# Patient Record
Sex: Male | Born: 1940 | Race: White | Hispanic: No | Marital: Married | Smoking: Former smoker
Health system: Southern US, Community
[De-identification: ages and names within clinical notes are randomized; demographics above are authoritative.]

## PROBLEM LIST (undated history)

## (undated) DIAGNOSIS — E7800 Pure hypercholesterolemia, unspecified: Secondary | ICD-10-CM

## (undated) DIAGNOSIS — N400 Enlarged prostate without lower urinary tract symptoms: Secondary | ICD-10-CM

## (undated) DIAGNOSIS — J189 Pneumonia, unspecified organism: Secondary | ICD-10-CM

## (undated) DIAGNOSIS — N200 Calculus of kidney: Secondary | ICD-10-CM

## (undated) DIAGNOSIS — C4490 Unspecified malignant neoplasm of skin, unspecified: Secondary | ICD-10-CM

## (undated) DIAGNOSIS — I10 Essential (primary) hypertension: Secondary | ICD-10-CM

## (undated) HISTORY — PX: OTHER SURGICAL HISTORY: SHX169

## (undated) HISTORY — PX: COLONOSCOPY: SHX174

## (undated) HISTORY — PX: SEPTOPLASTY: SUR1290

## (undated) HISTORY — PX: TONSILLECTOMY: SUR1361

---

## 2007-04-25 ENCOUNTER — Ambulatory Visit: Payer: Self-pay | Admitting: Gastroenterology

## 2008-10-11 ENCOUNTER — Emergency Department: Payer: Self-pay | Admitting: Emergency Medicine

## 2008-10-21 ENCOUNTER — Emergency Department: Payer: Self-pay | Admitting: Emergency Medicine

## 2013-07-02 ENCOUNTER — Ambulatory Visit: Payer: Self-pay | Admitting: Gastroenterology

## 2013-07-04 LAB — PATHOLOGY REPORT

## 2014-05-02 ENCOUNTER — Ambulatory Visit: Payer: Self-pay | Admitting: Internal Medicine

## 2014-05-02 IMAGING — US ULTRASOUND RETROPERITONEAL COMPLETE
1 series · 14 of 25 positions shown · non-contrast
Comparison: None.

CLINICAL DATA: Abdominal aortic aneurysm screening. Hypertension.
Former smoker.

EXAM:
RETROPERITONEAL ULTRASOUND COMPLETE
TECHNIQUE: Ultrasound examination of the abdominal aorta was performed to
evaluate for abdominal aortic aneurysm. The common iliac arteries,
IVC, and kidneys were also evaluated.

[Series 1: ultrasound retroperitoneal complete · 0.30mm/px · 14 of 37 slices shown]
[im 1/37]
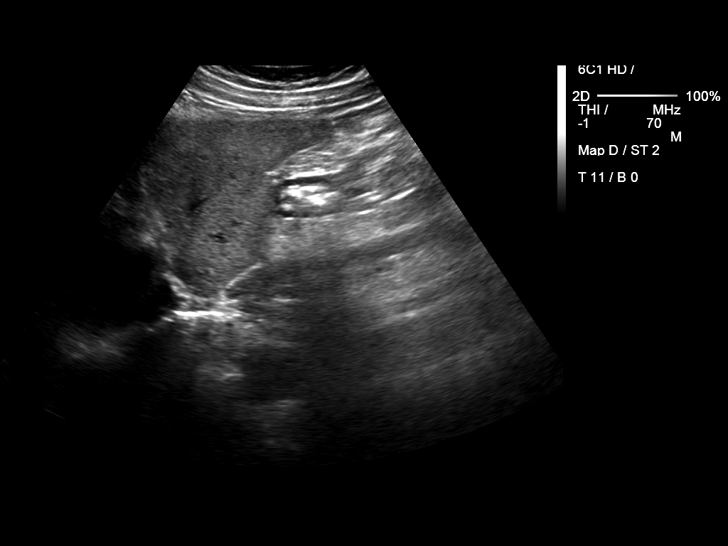
[im 4/37]
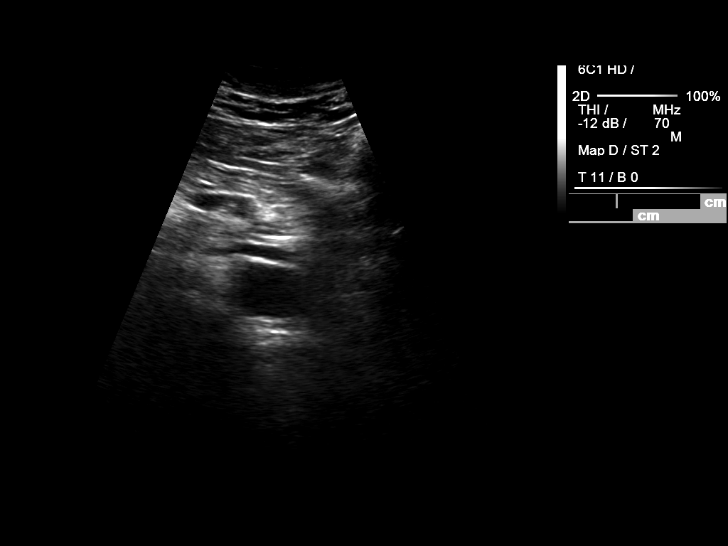
[im 7/37]
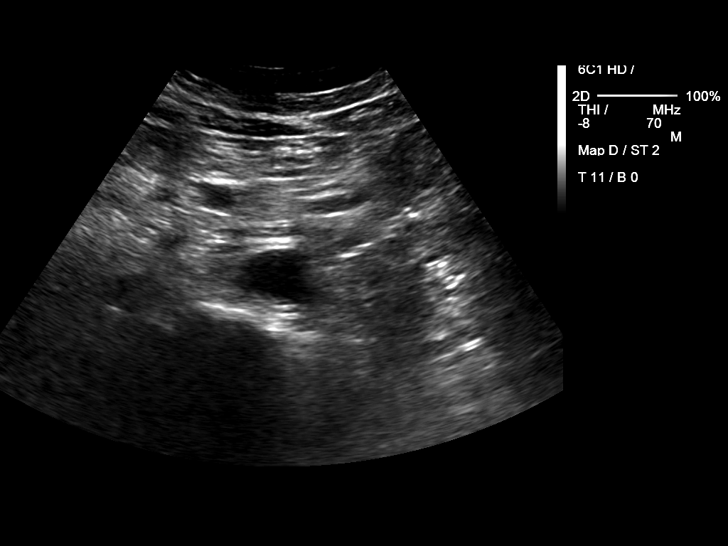
[im 10/37]
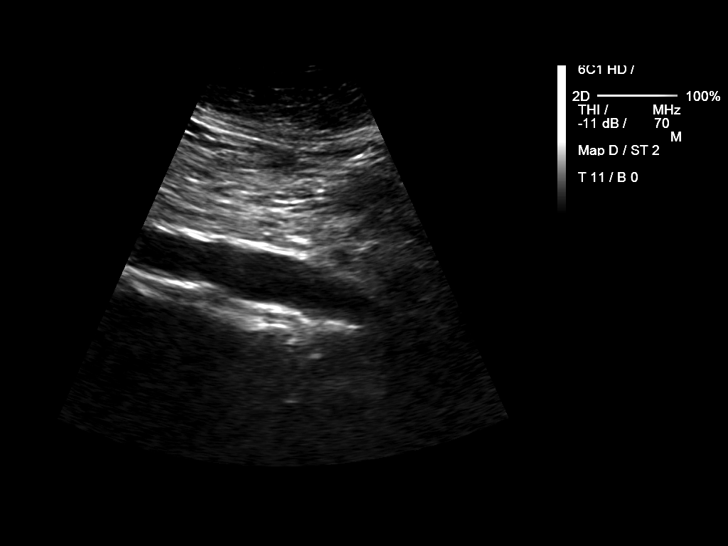
[im 13/37]
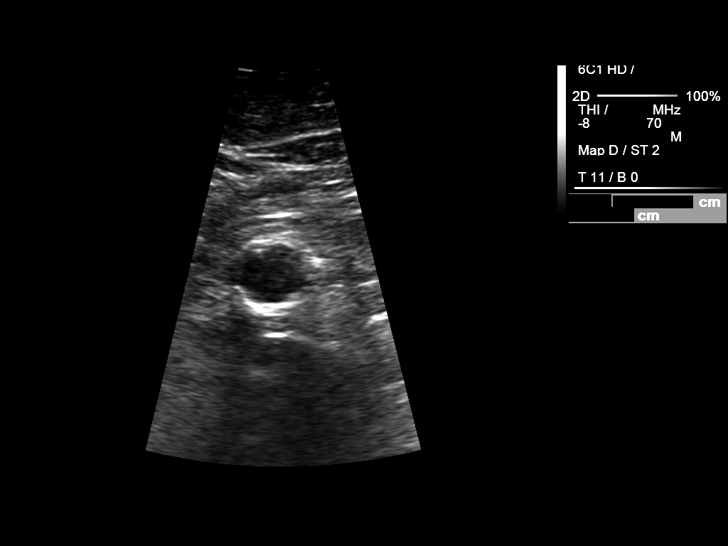
[im 14/37]
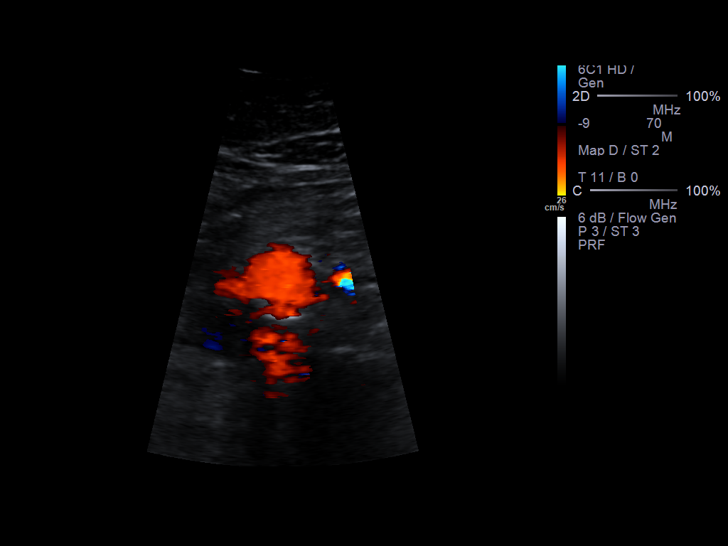
[im 17/37]
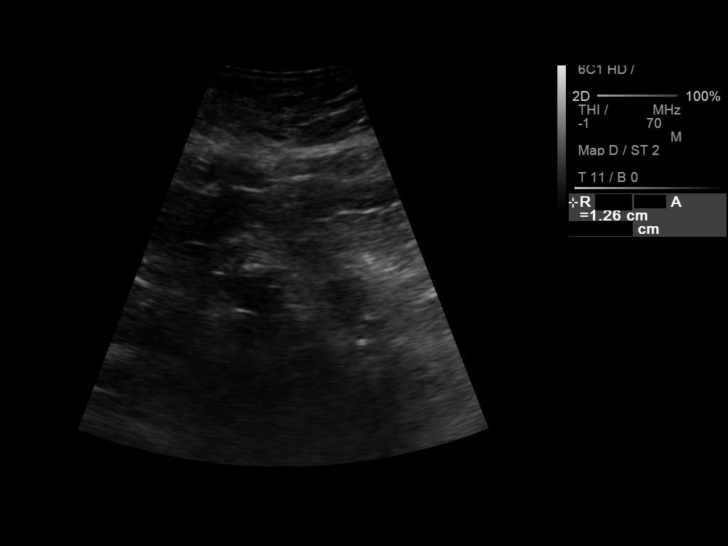
[im 20/37]
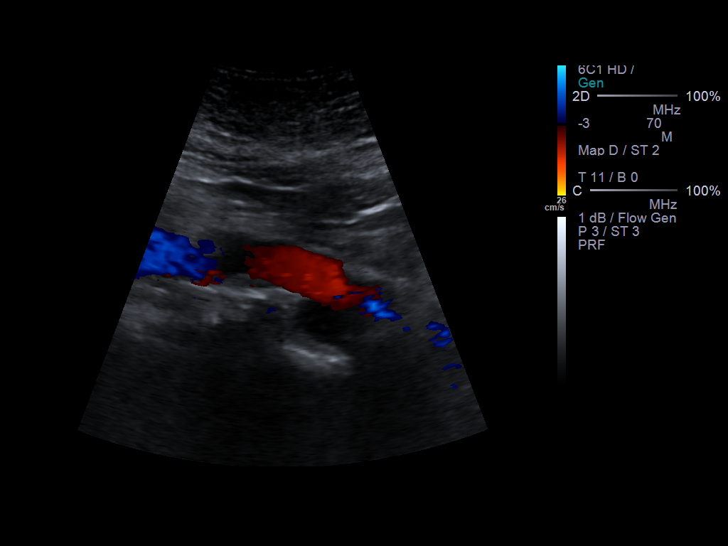
[im 23/37]
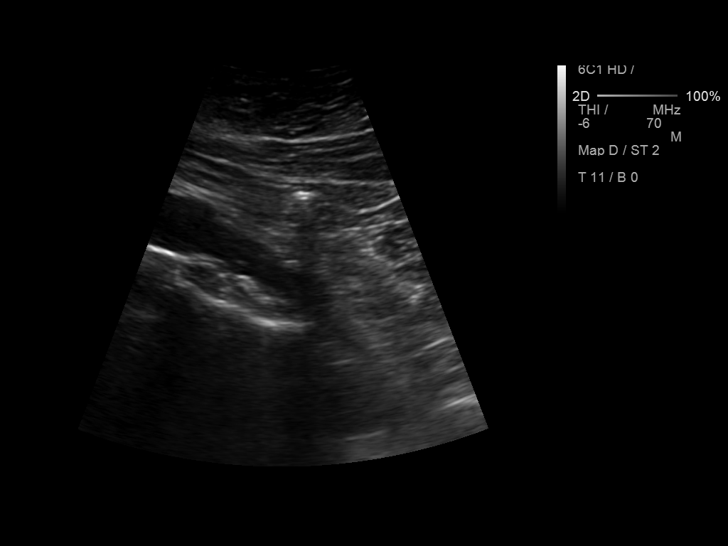
[im 25/37]
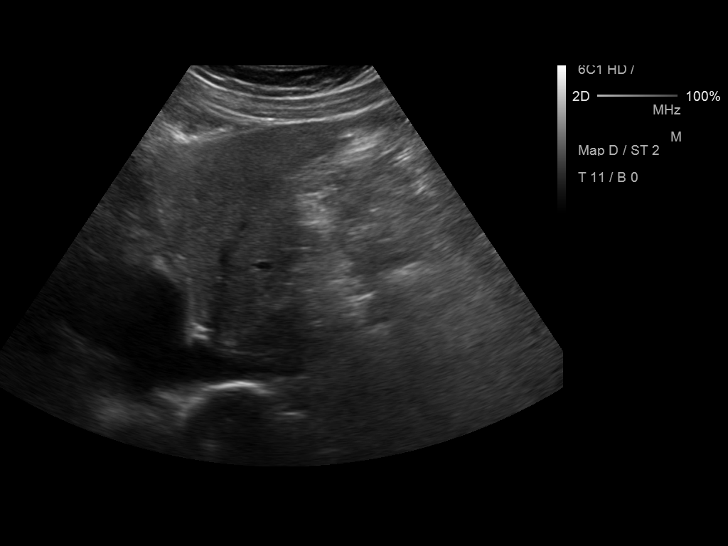
[im 28/37]
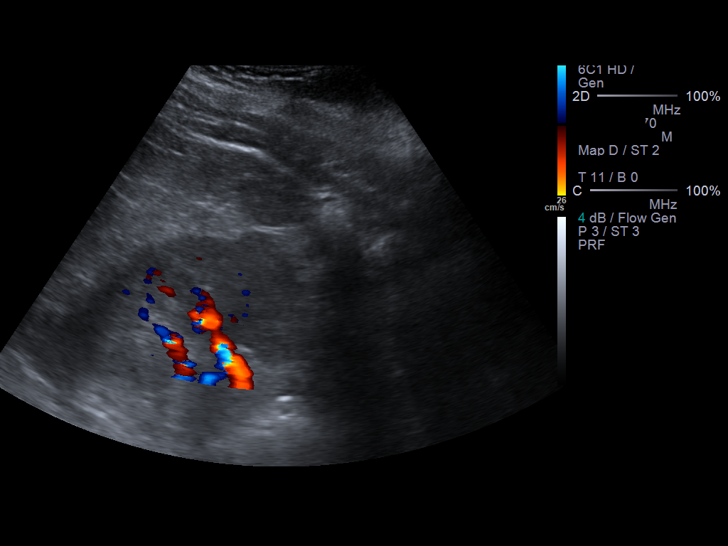
[im 31/37]
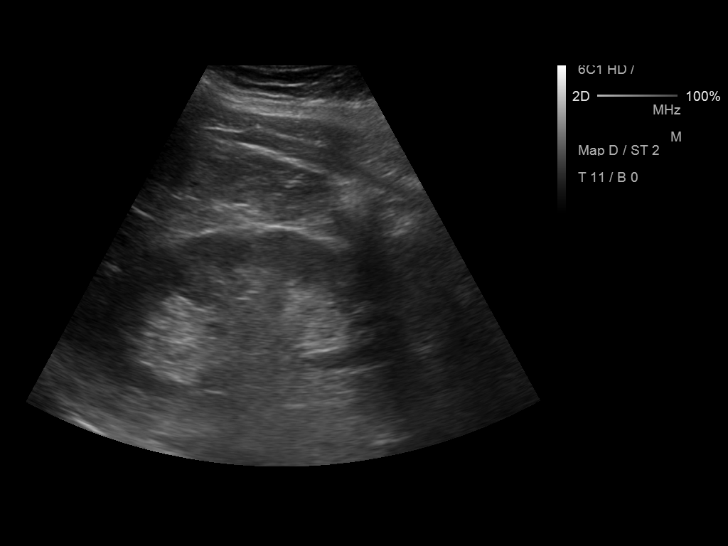
[im 34/37]
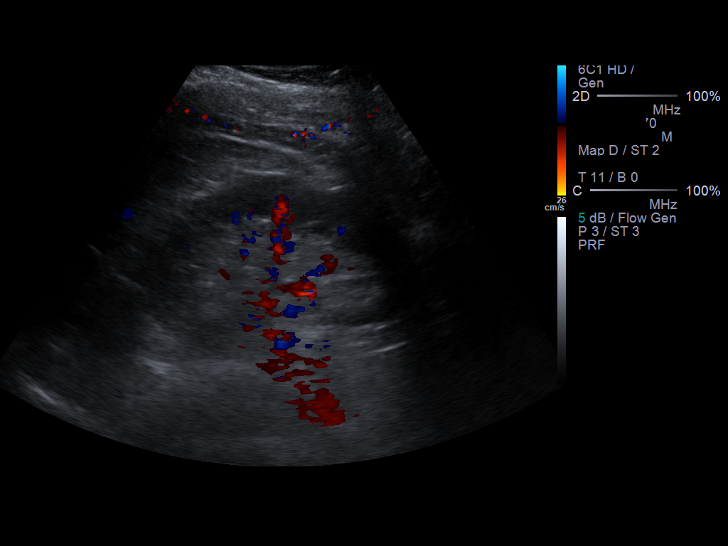
[im 37/37]
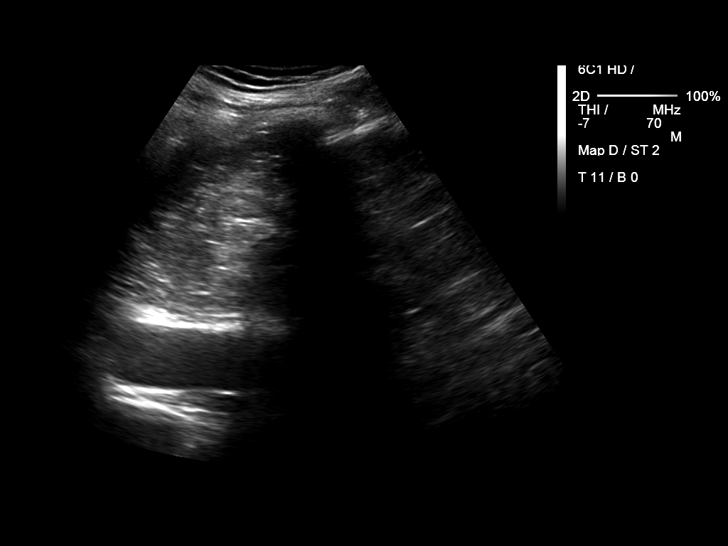

[14 of 25 positions shown; findings below may reference images not displayed]

FINDINGS: Abdominal Aorta

No aneurysm identified.

Maximum AP

Diameter:  2.6 cm

Maximum TRV

Diameter: 2.5 cm

Right Common Iliac Artery

No aneurysm identified.

Left Common Iliac Artery

No aneurysm identified.

IVC

No abnormality visualized.

Right Kidney

Length: 11.2 cm Echogenicity within normal limits. No mass or
hydronephrosis visualized.

Left Kidney

Length: 11.1 cm Echogenicity within normal limits. No mass or
hydronephrosis visualized.
IMPRESSION: Unremarkable exam.  No evidence of an aortic aneurysm.

## 2015-11-13 ENCOUNTER — Ambulatory Visit: Payer: Federal, State, Local not specified - PPO | Admitting: Anesthesiology

## 2015-11-13 ENCOUNTER — Encounter
Admission: RE | Disposition: A | Discharge status: Home / Self Care | Payer: Self-pay | Source: Ambulatory Visit | Attending: Ophthalmology

## 2015-11-13 ENCOUNTER — Ambulatory Visit
Admission: RE | Admit: 2015-11-13 | Discharge: 2015-11-13 | Disposition: A | Discharge status: Home / Self Care | Payer: Federal, State, Local not specified - PPO | Source: Ambulatory Visit | Attending: Ophthalmology | Admitting: Ophthalmology

## 2015-11-13 DIAGNOSIS — Z87891 Personal history of nicotine dependence: Secondary | ICD-10-CM | POA: Insufficient documentation

## 2015-11-13 DIAGNOSIS — Z880 Allergy status to penicillin: Secondary | ICD-10-CM | POA: Insufficient documentation

## 2015-11-13 DIAGNOSIS — N400 Enlarged prostate without lower urinary tract symptoms: Secondary | ICD-10-CM | POA: Insufficient documentation

## 2015-11-13 DIAGNOSIS — E7800 Pure hypercholesterolemia, unspecified: Secondary | ICD-10-CM | POA: Insufficient documentation

## 2015-11-13 DIAGNOSIS — I10 Essential (primary) hypertension: Secondary | ICD-10-CM | POA: Diagnosis not present

## 2015-11-13 DIAGNOSIS — Z87442 Personal history of urinary calculi: Secondary | ICD-10-CM | POA: Diagnosis not present

## 2015-11-13 DIAGNOSIS — Z85828 Personal history of other malignant neoplasm of skin: Secondary | ICD-10-CM | POA: Insufficient documentation

## 2015-11-13 DIAGNOSIS — H2511 Age-related nuclear cataract, right eye: Secondary | ICD-10-CM | POA: Insufficient documentation

## 2015-11-13 HISTORY — PX: CATARACT EXTRACTION W/PHACO: SHX586

## 2015-11-13 HISTORY — DX: Unspecified malignant neoplasm of skin, unspecified: C44.90

## 2015-11-13 HISTORY — DX: Calculus of kidney: N20.0

## 2015-11-13 HISTORY — DX: Pure hypercholesterolemia, unspecified: E78.00

## 2015-11-13 HISTORY — DX: Pneumonia, unspecified organism: J18.9

## 2015-11-13 HISTORY — DX: Benign prostatic hyperplasia without lower urinary tract symptoms: N40.0

## 2015-11-13 HISTORY — DX: Essential (primary) hypertension: I10

## 2015-11-13 SURGERY — PHACOEMULSIFICATION, CATARACT, WITH IOL INSERTION
Anesthesia: Monitor Anesthesia Care | Laterality: Right | Wound class: Clean

## 2015-11-13 MED ORDER — LIDOCAINE HCL (PF) 4 % IJ SOLN
INTRAOCULAR | Status: DC | PRN
  Administered 2015-11-13: .5 mL via OPHTHALMIC

## 2015-11-13 MED ORDER — NEOMYCIN-POLYMYXIN-DEXAMETH 0.1 % OP OINT
TOPICAL_OINTMENT | OPHTHALMIC | Status: DC | PRN
  Administered 2015-11-13: 1 via OPHTHALMIC

## 2015-11-13 MED ORDER — NA HYALUR & NA CHOND-NA HYALUR 0.4-0.35 ML IO KIT
PACK | INTRAOCULAR | Status: DC | PRN
  Administered 2015-11-13: .75 mL via INTRAOCULAR

## 2015-11-13 MED ORDER — TETRACAINE HCL 0.5 % OP SOLN
1.0000 [drp] | Freq: Once | OPHTHALMIC | Status: AC
  Administered 2015-11-13: 1 [drp] via OPHTHALMIC

## 2015-11-13 MED ORDER — EPINEPHRINE HCL 1 MG/ML IJ SOLN
INTRAMUSCULAR | Status: DC | PRN
  Administered 2015-11-13: 250 mL via OPHTHALMIC

## 2015-11-13 MED ORDER — POVIDONE-IODINE 5 % OP SOLN
OPHTHALMIC | Status: AC
  Administered 2015-11-13: 1 via OPHTHALMIC
  Filled 2015-11-13: qty 30

## 2015-11-13 MED ORDER — NEOMYCIN-POLYMYXIN-DEXAMETH 3.5-10000-0.1 OP OINT
TOPICAL_OINTMENT | OPHTHALMIC | Status: AC
  Filled 2015-11-13: qty 3.5

## 2015-11-13 MED ORDER — POVIDONE-IODINE 5 % OP SOLN
1.0000 "application " | Freq: Once | OPHTHALMIC | Status: AC
  Administered 2015-11-13: 1 via OPHTHALMIC

## 2015-11-13 MED ORDER — SODIUM CHLORIDE 0.9 % IV SOLN
INTRAVENOUS | Status: DC
  Administered 2015-11-13: 07:00:00 via INTRAVENOUS

## 2015-11-13 MED ORDER — MOXIFLOXACIN HCL 0.5 % OP SOLN
OPHTHALMIC | Status: AC
  Filled 2015-11-13: qty 3

## 2015-11-13 MED ORDER — CARBACHOL 0.01 % IO SOLN
INTRAOCULAR | Status: DC | PRN
  Administered 2015-11-13: .5 mL via INTRAOCULAR

## 2015-11-13 MED ORDER — LIDOCAINE HCL (PF) 4 % IJ SOLN
INTRAMUSCULAR | Status: AC
  Filled 2015-11-13: qty 5

## 2015-11-13 MED ORDER — EPINEPHRINE HCL 1 MG/ML IJ SOLN
INTRAMUSCULAR | Status: AC
  Filled 2015-11-13: qty 2

## 2015-11-13 MED ORDER — ARMC OPHTHALMIC DILATING GEL
OPHTHALMIC | Status: AC
  Administered 2015-11-13: 1 via OPHTHALMIC
  Filled 2015-11-13: qty 0.25

## 2015-11-13 MED ORDER — CEFUROXIME OPHTHALMIC INJECTION 1 MG/0.1 ML
INJECTION | OPHTHALMIC | Status: AC
  Filled 2015-11-13: qty 0.1

## 2015-11-13 MED ORDER — FENTANYL CITRATE (PF) 100 MCG/2ML IJ SOLN
INTRAMUSCULAR | Status: DC | PRN
  Administered 2015-11-13: 50 ug via INTRAVENOUS

## 2015-11-13 MED ORDER — NA HYALUR & NA CHOND-NA HYALUR 0.55-0.5 ML IO KIT
PACK | INTRAOCULAR | Status: AC
  Filled 2015-11-13: qty 1.05

## 2015-11-13 MED ORDER — CEFUROXIME OPHTHALMIC INJECTION 1 MG/0.1 ML
INJECTION | OPHTHALMIC | Status: DC | PRN
  Administered 2015-11-13: .1 mL via INTRACAMERAL

## 2015-11-13 MED ORDER — ARMC OPHTHALMIC DILATING GEL
1.0000 "application " | OPHTHALMIC | Status: DC | PRN
  Administered 2015-11-13 (×2): 1 via OPHTHALMIC

## 2015-11-13 MED ORDER — TETRACAINE HCL 0.5 % OP SOLN
OPHTHALMIC | Status: AC
  Administered 2015-11-13: 1 [drp] via OPHTHALMIC
  Filled 2015-11-13: qty 2

## 2015-11-13 SURGICAL SUPPLY — 22 items
CANNULA ANT/CHMB 27GA (MISCELLANEOUS) ×3 IMPLANT
CUP MEDICINE 2OZ PLAST GRAD ST (MISCELLANEOUS) ×3 IMPLANT
GLOVE BIO SURGEON STRL SZ8 (GLOVE) ×3 IMPLANT
GLOVE BIOGEL M 6.5 STRL (GLOVE) ×3 IMPLANT
GLOVE SURG LX 7.5 STRW (GLOVE) ×2
GLOVE SURG LX STRL 7.5 STRW (GLOVE) ×1 IMPLANT
GOWN STRL REUS W/ TWL LRG LVL3 (GOWN DISPOSABLE) ×2 IMPLANT
GOWN STRL REUS W/TWL LRG LVL3 (GOWN DISPOSABLE) ×4
LENS IOL TECNIS 15.0 (Intraocular Lens) ×3 IMPLANT
LENS IOL TECNIS MONO 1P 15.0 (Intraocular Lens) ×1 IMPLANT
PACK CATARACT (MISCELLANEOUS) ×3 IMPLANT
PACK CATARACT BRASINGTON LX (MISCELLANEOUS) ×3 IMPLANT
PACK EYE AFTER SURG (MISCELLANEOUS) ×3 IMPLANT
SOL BSS BAG (MISCELLANEOUS) ×3
SOL PREP PVP 2OZ (MISCELLANEOUS) ×3
SOLUTION BSS BAG (MISCELLANEOUS) ×1 IMPLANT
SOLUTION PREP PVP 2OZ (MISCELLANEOUS) ×1 IMPLANT
SYR 3ML LL SCALE MARK (SYRINGE) ×3 IMPLANT
SYR 5ML LL (SYRINGE) ×3 IMPLANT
SYR TB 1ML 27GX1/2 LL (SYRINGE) ×3 IMPLANT
WATER STERILE IRR 1000ML POUR (IV SOLUTION) ×3 IMPLANT
WIPE NON LINTING 3.25X3.25 (MISCELLANEOUS) ×3 IMPLANT

## 2015-11-13 NOTE — Discharge Instructions (Signed)
Eye Surgery Discharge Instructions  Expect mild scratchy sensation or mild soreness. DO NOT RUB YOUR EYE!  The day of surgery: . Minimal physical activity, but bed rest is not required . No reading, computer work, or close hand work . No bending, lifting, or straining. . May watch TV  For 24 hours: . No driving, legal decisions, or alcoholic beverages . Safety precautions . Eat anything you prefer: It is better to start with liquids, then soup then solid foods. . _____ Eye patch should be worn until postoperative exam tomorrow. . __x__ Solar shield eyeglasses should be worn for comfort in the sunlight/patch while sleeping  Resume all regular medications including aspirin or Coumadin if these were discontinued prior to surgery. You may shower, bathe, shave, or wash your hair. Tylenol may be taken for mild discomfort.  Call your doctor if you experience significant pain, nausea, or vomiting, fever > 101 or other signs of infection. [phone] or [phone] Specific instructions:  Follow-up Information    Follow up with Leandrew Koyanagi, MD On 11/13/2015.   Specialty:  Ophthalmology   Why:  at 3:40 today   Contact information:   [address]   [place] [address] [phone]

## 2015-11-13 NOTE — H&P (Signed)
  The History and Physical notes are on paper, have been signed, and are to be scanned. The patient remains stable and unchanged from the H&P.   Previous H&P reviewed, patient examined, and there are no changes.  Christopher Higgins 11/13/2015 7:31 AM  

## 2015-11-13 NOTE — Transfer of Care (Signed)
Immediate Anesthesia Transfer of Care Note  Patient: Christopher Higgins  Procedure(s) Performed: Procedure(s) with comments: CATARACT EXTRACTION PHACO AND INTRAOCULAR LENS PLACEMENT (IOC) (Right) - [place] AP% CDE  casette lot [id number]   exp3/30/2018  Patient Location: PACU and Short Stay  Anesthesia Type:MAC  Level of Consciousness: awake, alert  and oriented  Airway & Oxygen Therapy: Patient Spontanous Breathing and Patient connected to face mask oxygen  Post-op Assessment: Report given to RN and Post -op Vital signs reviewed and stable  Post vital signs: Reviewed and stable  Last Vitals: 0834 145/76 98.3temp 97% 54 hr 18 resp  Filed Vitals:   11/13/15 0639  BP: 155/91  Pulse: 56  Temp: 36.4 C  Resp: 16    Complications: No apparent anesthesia complications

## 2015-11-13 NOTE — Anesthesia Postprocedure Evaluation (Signed)
Anesthesia Post Note  Patient: Christopher Higgins  Procedure(s) Performed: Procedure(s) (LRB): CATARACT EXTRACTION PHACO AND INTRAOCULAR LENS PLACEMENT (IOC) (Right)  Patient location during evaluation: Short Stay Anesthesia Type: MAC Level of consciousness: awake, awake and alert and oriented Pain management: pain level controlled Vital Signs Assessment: post-procedure vital signs reviewed and stable Respiratory status: spontaneous breathing Cardiovascular status: stable Postop Assessment: no backache and no headache    Last Vitals:  Filed Vitals:   11/13/15 0639  BP: 155/91  Pulse: 56  Temp: 36.4 C  Resp: 16    Last Pain: There were no vitals filed for this visit.               Delaney Meigs

## 2015-11-13 NOTE — Op Note (Signed)
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 Item Serial No. Manufacturer Lot No. LRB No. Used  LENS IOL TECNIS 15.0 - [id number] Intraocular Lens LENS IOL TECNIS 15.0 [id number]   Right 1       ULTRASOUND TIME: 14 %  of 0 minutes 56 seconds, CDE 7.8  SURGEON:  Wyonia Hough, MD   ANESTHESIA:  Topical with tetracaine drops and 2% Xylocaine jelly, augmented with 1% preservative-free intracameral lidocaine.    COMPLICATIONS:  None.   DESCRIPTION OF PROCEDURE:  The patient was identified in the holding room and transported to the operating room and placed in the supine position under the operating microscope. Theright eye was identified as the operative eye and it was prepped and draped in the usual sterile ophthalmic fashion.   A 1 millimeter clear-corneal paracentesis was made at the 12:00 position.  0.5 ml of preservative-free 1% lidocaine was injected into the anterior chamber. The anterior chamber was filled with Viscoat viscoelastic.  A 2.4 millimeter keratome was used to make a near-clear corneal incision at the 9:00 position. A curvilinear capsulorrhexis was made with a cystotome and capsulorrhexis forceps.  Balanced salt solution was used to hydrodissect and hydrodelineate the nucleus.   Phacoemulsification was then used in stop and chop fashion to remove the lens nucleus and epinucleus.  The remaining cortex was then removed using the irrigation and aspiration handpiece. Provisc was then placed into the capsular bag to distend it for lens placement.  A lens was then injected into the capsular bag.  The remaining viscoelastic was aspirated.  Wounds were hydrated with balanced  salt solution.  The anterior chamber was inflated to a physiologic pressure with balanced salt solution. Cefuroxime 0.1 ml of a 10mg /ml solution was injected into the anterior chamber for a dose of 1 mg of intracameral antibiotic at the completion of the case. Miostat was placed into the anterior chamber to constrict the pupil.  No wound leaks were noted.  Topical Vigamox drops and Maxitrol ointment were applied to the eye.  The patient was taken to the recovery room in stable condition without complications of anesthesia or surgery.  Christopher Higgins 11/13/2015, 8:32 AM

## 2015-11-13 NOTE — Anesthesia Preprocedure Evaluation (Signed)
Anesthesia Evaluation  Patient identified by MRN, date of birth, ID band Patient awake    Reviewed: Allergy & Precautions, NPO status , Patient's Chart, lab work & pertinent test results  Airway Mallampati: II  TM Distance: >3 FB Neck ROM: Full    Dental   Pulmonary former smoker,    Pulmonary exam normal breath sounds clear to auscultation       Cardiovascular hypertension, Pt. on medications Normal cardiovascular exam     Neuro/Psych negative neurological ROS  negative psych ROS   GI/Hepatic negative GI ROS, Neg liver ROS,   Endo/Other  negative endocrine ROS  Renal/GU stones  negative genitourinary   Musculoskeletal negative musculoskeletal ROS (+)   Abdominal Normal abdominal exam  (+)   Peds negative pediatric ROS (+)  Hematology negative hematology ROS (+)   Anesthesia Other Findings   Reproductive/Obstetrics                             Anesthesia Physical Anesthesia Plan  ASA: II  Anesthesia Plan: MAC and General   Post-op Pain Management:    Induction: Intravenous  Airway Management Planned: Nasal Cannula  Additional Equipment:   Intra-op Plan:   Post-operative Plan:   Informed Consent: I have reviewed the patients History and Physical, chart, labs and discussed the procedure including the risks, benefits and alternatives for the proposed anesthesia with the patient or authorized representative who has indicated his/her understanding and acceptance.   Dental advisory given  Plan Discussed with: CRNA and Surgeon  Anesthesia Plan Comments:         Anesthesia Quick Evaluation

## 2015-12-08 ENCOUNTER — Encounter: Payer: Self-pay | Admitting: *Deleted

## 2015-12-11 ENCOUNTER — Ambulatory Visit
Admission: RE | Admit: 2015-12-11 | Discharge: 2015-12-11 | Disposition: A | Discharge status: Home / Self Care | Payer: Federal, State, Local not specified - PPO | Source: Ambulatory Visit | Attending: Ophthalmology | Admitting: Ophthalmology

## 2015-12-11 ENCOUNTER — Encounter: Payer: Self-pay | Admitting: *Deleted

## 2015-12-11 ENCOUNTER — Ambulatory Visit: Payer: Federal, State, Local not specified - PPO | Admitting: Anesthesiology

## 2015-12-11 ENCOUNTER — Encounter
Admission: RE | Disposition: A | Discharge status: Home / Self Care | Payer: Self-pay | Source: Ambulatory Visit | Attending: Ophthalmology

## 2015-12-11 DIAGNOSIS — Z87891 Personal history of nicotine dependence: Secondary | ICD-10-CM | POA: Diagnosis not present

## 2015-12-11 DIAGNOSIS — H2512 Age-related nuclear cataract, left eye: Secondary | ICD-10-CM | POA: Diagnosis present

## 2015-12-11 DIAGNOSIS — N400 Enlarged prostate without lower urinary tract symptoms: Secondary | ICD-10-CM | POA: Diagnosis not present

## 2015-12-11 DIAGNOSIS — I10 Essential (primary) hypertension: Secondary | ICD-10-CM | POA: Diagnosis not present

## 2015-12-11 DIAGNOSIS — Z87442 Personal history of urinary calculi: Secondary | ICD-10-CM | POA: Diagnosis not present

## 2015-12-11 DIAGNOSIS — Z880 Allergy status to penicillin: Secondary | ICD-10-CM | POA: Insufficient documentation

## 2015-12-11 DIAGNOSIS — Z9841 Cataract extraction status, right eye: Secondary | ICD-10-CM | POA: Insufficient documentation

## 2015-12-11 DIAGNOSIS — Z85828 Personal history of other malignant neoplasm of skin: Secondary | ICD-10-CM | POA: Insufficient documentation

## 2015-12-11 DIAGNOSIS — E7800 Pure hypercholesterolemia, unspecified: Secondary | ICD-10-CM | POA: Diagnosis not present

## 2015-12-11 HISTORY — PX: CATARACT EXTRACTION W/PHACO: SHX586

## 2015-12-11 SURGERY — PHACOEMULSIFICATION, CATARACT, WITH IOL INSERTION
Anesthesia: Monitor Anesthesia Care | Laterality: Left | Wound class: Clean

## 2015-12-11 MED ORDER — MOXIFLOXACIN HCL 0.5 % OP SOLN: 1.0000 [drp] | OPHTHALMIC | Status: DC | PRN

## 2015-12-11 MED ORDER — TETRACAINE HCL 0.5 % OP SOLN
1.0000 [drp] | OPHTHALMIC | Status: AC | PRN
  Administered 2015-12-11: 1 [drp] via OPHTHALMIC

## 2015-12-11 MED ORDER — ARMC OPHTHALMIC DILATING GEL
OPHTHALMIC | Status: AC
  Administered 2015-12-11: 1 via OPHTHALMIC
  Filled 2015-12-11: qty 0.25

## 2015-12-11 MED ORDER — NEOMYCIN-POLYMYXIN-DEXAMETH 0.1 % OP OINT
TOPICAL_OINTMENT | OPHTHALMIC | Status: DC | PRN
  Administered 2015-12-11: 1 via OPHTHALMIC

## 2015-12-11 MED ORDER — CEFUROXIME OPHTHALMIC INJECTION 1 MG/0.1 ML
INJECTION | OPHTHALMIC | Status: DC | PRN
  Administered 2015-12-11: 0.1 mL via INTRACAMERAL

## 2015-12-11 MED ORDER — NEOMYCIN-POLYMYXIN-DEXAMETH 3.5-10000-0.1 OP OINT
TOPICAL_OINTMENT | OPHTHALMIC | Status: AC
  Filled 2015-12-11: qty 3.5

## 2015-12-11 MED ORDER — EPINEPHRINE HCL 1 MG/ML IJ SOLN
INTRAMUSCULAR | Status: AC
  Filled 2015-12-11: qty 2

## 2015-12-11 MED ORDER — NA HYALUR & NA CHOND-NA HYALUR 0.4-0.35 ML IO KIT
PACK | INTRAOCULAR | Status: DC | PRN
  Administered 2015-12-11: .75 mL via INTRAOCULAR

## 2015-12-11 MED ORDER — POVIDONE-IODINE 5 % OP SOLN
OPHTHALMIC | Status: AC
  Administered 2015-12-11: 1 via OPHTHALMIC
  Filled 2015-12-11: qty 30

## 2015-12-11 MED ORDER — CEFUROXIME OPHTHALMIC INJECTION 1 MG/0.1 ML
INJECTION | OPHTHALMIC | Status: AC
  Filled 2015-12-11: qty 0.1

## 2015-12-11 MED ORDER — MIDAZOLAM HCL 2 MG/2ML IJ SOLN
INTRAMUSCULAR | Status: DC | PRN
  Administered 2015-12-11: 1 mg via INTRAVENOUS

## 2015-12-11 MED ORDER — LIDOCAINE HCL (PF) 4 % IJ SOLN
INTRAMUSCULAR | Status: AC
  Filled 2015-12-11: qty 5

## 2015-12-11 MED ORDER — TETRACAINE HCL 0.5 % OP SOLN
OPHTHALMIC | Status: AC
  Administered 2015-12-11: 1 [drp] via OPHTHALMIC
  Filled 2015-12-11: qty 2

## 2015-12-11 MED ORDER — ARMC OPHTHALMIC DILATING GEL
1.0000 "application " | OPHTHALMIC | Status: DC | PRN
  Administered 2015-12-11: 1 via OPHTHALMIC

## 2015-12-11 MED ORDER — CARBACHOL 0.01 % IO SOLN
INTRAOCULAR | Status: DC | PRN
  Administered 2015-12-11: 0.5 mL via INTRAOCULAR

## 2015-12-11 MED ORDER — SODIUM CHLORIDE 0.9 % IV SOLN
INTRAVENOUS | Status: DC
  Administered 2015-12-11: 07:00:00 via INTRAVENOUS

## 2015-12-11 MED ORDER — POVIDONE-IODINE 5 % OP SOLN
1.0000 "application " | OPHTHALMIC | Status: AC | PRN
  Administered 2015-12-11: 1 via OPHTHALMIC

## 2015-12-11 MED ORDER — MOXIFLOXACIN HCL 0.5 % OP SOLN
OPHTHALMIC | Status: AC
  Filled 2015-12-11: qty 3

## 2015-12-11 MED ORDER — LIDOCAINE HCL (PF) 4 % IJ SOLN
INTRAOCULAR | Status: DC | PRN
  Administered 2015-12-11: .5 mL via OPHTHALMIC

## 2015-12-11 MED ORDER — NA HYALUR & NA CHOND-NA HYALUR 0.55-0.5 ML IO KIT
PACK | INTRAOCULAR | Status: AC
  Filled 2015-12-11: qty 1.05

## 2015-12-11 MED ORDER — EPINEPHRINE HCL 1 MG/ML IJ SOLN
INTRAOCULAR | Status: DC | PRN
  Administered 2015-12-11: 250 mL via OPHTHALMIC

## 2015-12-11 SURGICAL SUPPLY — 23 items
BSS 15 ML ×2 IMPLANT
CANNULA ANT/CHMB 27GA (MISCELLANEOUS) ×2 IMPLANT
CUP MEDICINE 2OZ PLAST GRAD ST (MISCELLANEOUS) ×2 IMPLANT
GLOVE BIO SURGEON STRL SZ8 (GLOVE) ×2 IMPLANT
GLOVE BIOGEL M 6.5 STRL (GLOVE) ×2 IMPLANT
GLOVE SURG LX 7.5 STRW (GLOVE) ×1
GLOVE SURG LX STRL 7.5 STRW (GLOVE) ×1 IMPLANT
GOWN STRL REUS W/ TWL LRG LVL3 (GOWN DISPOSABLE) ×2 IMPLANT
GOWN STRL REUS W/TWL LRG LVL3 (GOWN DISPOSABLE) ×2
LENS IOL TECNIS 14.5 (Intraocular Lens) ×2 IMPLANT
LENS IOL TECNIS MONO 1P 14.5 (Intraocular Lens) ×1 IMPLANT
PACK CATARACT (MISCELLANEOUS) ×2 IMPLANT
PACK CATARACT BRASINGTON LX (MISCELLANEOUS) ×2 IMPLANT
PACK EYE AFTER SURG (MISCELLANEOUS) ×2 IMPLANT
SOL BSS BAG (MISCELLANEOUS) ×2
SOL PREP PVP 2OZ (MISCELLANEOUS)
SOLUTION BSS BAG (MISCELLANEOUS) ×1 IMPLANT
SOLUTION PREP PVP 2OZ (MISCELLANEOUS) IMPLANT
SYR 3ML LL SCALE MARK (SYRINGE) ×2 IMPLANT
SYR 5ML LL (SYRINGE) ×4 IMPLANT
SYR TB 1ML 27GX1/2 LL (SYRINGE) ×2 IMPLANT
WATER STERILE IRR 1000ML POUR (IV SOLUTION) ×2 IMPLANT
WIPE NON LINTING 3.25X3.25 (MISCELLANEOUS) ×2 IMPLANT

## 2015-12-11 NOTE — Anesthesia Postprocedure Evaluation (Signed)
Anesthesia Post Note  Patient: Christopher Higgins  Procedure(s) Performed: Procedure(s) (LRB): CATARACT EXTRACTION PHACO AND INTRAOCULAR LENS PLACEMENT (IOC) (Left)  Patient location during evaluation: PACU Anesthesia Type: MAC Level of consciousness: awake and alert and oriented Pain management: pain level controlled Vital Signs Assessment: post-procedure vital signs reviewed and stable Respiratory status: spontaneous breathing Cardiovascular status: stable Anesthetic complications: no    Last Vitals:  Filed Vitals:   12/11/15 0605 12/11/15 0807  BP: 142/80 131/84  Pulse: 61 57  Temp: 35.6 C 36.6 C  Resp: 16 12    Last Pain:  Filed Vitals:   12/11/15 0808  PainSc: 0-No pain                 Chihiro Frey,  Clearnce Sorrel

## 2015-12-11 NOTE — Anesthesia Preprocedure Evaluation (Signed)
Anesthesia Evaluation  Patient identified by MRN, date of birth, ID band Patient awake    Reviewed: Allergy & Precautions, H&P , NPO status , Patient's Chart, lab work & pertinent test results  Airway Mallampati: II  TM Distance: >3 FB Neck ROM: Full    Dental   Pulmonary pneumonia, resolved, former smoker,    Pulmonary exam normal breath sounds clear to auscultation       Cardiovascular hypertension, Pt. on medications Normal cardiovascular exam     Neuro/Psych negative neurological ROS  negative psych ROS   GI/Hepatic negative GI ROS, Neg liver ROS,   Endo/Other  negative endocrine ROS  Renal/GU Renal diseasestones  negative genitourinary   Musculoskeletal negative musculoskeletal ROS (+)   Abdominal Normal abdominal exam  (+)   Peds negative pediatric ROS (+)  Hematology negative hematology ROS (+)   Anesthesia Other Findings Past Medical History:   Pneumonia                                                    Hypertension                                                 Kidney stone                                                 BPH (benign prostatic hypertrophy)                           Hypercholesteremia                                           Skin cancer                                                 Past Surgical History:   SEPTOPLASTY                                                   lung surgery for atelectasis                                  TONSILLECTOMY                                                 COLONOSCOPY  removal of skin cancers                                       CATARACT EXTRACTION W/PHACO                     Right 11/13/2015      Comment:Procedure: CATARACT EXTRACTION PHACO AND               INTRAOCULAR LENS PLACEMENT (IOC);  Surgeon:               Leandrew Koyanagi, MD;  Location: [facility] ORS;                Service: Ophthalmology;   Laterality: Right;                [place] AP% CDE  casette lot [id number]                 exp3/30/2018  BMI    Body Mass Index   29.30 kg/m 2      Reproductive/Obstetrics                             Anesthesia Physical  Anesthesia Plan  ASA: III  Anesthesia Plan: MAC and General   Post-op Pain Management:    Induction: Intravenous  Airway Management Planned: Nasal Cannula  Additional Equipment:   Intra-op Plan:   Post-operative Plan:   Informed Consent: I have reviewed the patients History and Physical, chart, labs and discussed the procedure including the risks, benefits and alternatives for the proposed anesthesia with the patient or authorized representative who has indicated his/her understanding and acceptance.   Dental advisory given  Plan Discussed with: CRNA and Surgeon  Anesthesia Plan Comments:         Anesthesia Quick Evaluation

## 2015-12-11 NOTE — H&P (Signed)
  The History and Physical notes are on paper, have been signed, and are to be scanned. The patient remains stable and unchanged from the H&P.   Previous H&P reviewed, patient examined, and there are no changes.  Christopher Higgins 12/11/2015 7:34 AM

## 2015-12-11 NOTE — Discharge Instructions (Signed)
AMBULATORY SURGERY  DISCHARGE INSTRUCTIONS   1) The drugs that you were given will stay in your system until tomorrow so for the next 24 hours you should not:  A) Drive an automobile B) Make any legal decisions C) Drink any alcoholic beverage   2) You may resume regular meals tomorrow.  Today it is better to start with liquids and gradually work up to solid foods.  You may eat anything you prefer, but it is better to start with liquids, then soup and crackers, and gradually work up to solid foods.   3) Please notify your doctor immediately if you have any unusual bleeding, trouble breathing, redness and pain at the surgery site, drainage, fever, or pain not relieved by medication.    4) Additional Instructions:   Eye Surgery Discharge Instructions  Expect mild scratchy sensation or mild soreness. DO NOT RUB YOUR EYE!  The day of surgery:  Minimal physical activity, but bed rest is not required  No reading, computer work, or close hand work  No bending, lifting, or straining.  May watch TV  For 24 hours:  No driving, legal decisions, or alcoholic beverages  Safety precautions  Eat anything you prefer: It is better to start with liquids, then soup then solid foods.  _____ Eye patch should be worn until postoperative exam tomorrow.  ____ Solar shield eyeglasses should be worn for comfort in the sunlight/patch while sleeping  Resume all regular medications including aspirin or Coumadin if these were discontinued prior to surgery. You may shower, bathe, shave, or wash your hair. Tylenol may be taken for mild discomfort.  Call your doctor if you experience significant pain, nausea, or vomiting, fever > 101 or other signs of infection. [phone] or [phone] Specific instructions:  Follow-up Information    Follow up with Leandrew Koyanagi, MD In 1 day.   Specialty:  Ophthalmology   Why:  March 17 at 11:05 am   Contact information:   [address]   [place] [address] [phone]          Please contact your physician with any problems or Same Day Surgery at [phone], Monday through Friday 6 am to 4 pm, or [place] at [facility] number at [phone].

## 2015-12-11 NOTE — Transfer of Care (Signed)
Immediate Anesthesia Transfer of Care Note  Patient: Christopher Higgins  Procedure(s) Performed: Procedure(s) with comments: CATARACT EXTRACTION PHACO AND INTRAOCULAR LENS PLACEMENT (IOC) (Left) - [place]   45.3 AP% 17.1% CDE  14.05 fluid casette lot # [id number] H exp. 02/24/2017  Patient Location: PACU  Anesthesia Type:MAC  Level of Consciousness: awake, alert  and oriented  Airway & Oxygen Therapy: Patient Spontanous Breathing  Post-op Assessment: Report given to RN and Post -op Vital signs reviewed and stable  Post vital signs: stable  Last Vitals:  Filed Vitals:   12/11/15 0605 12/11/15 0807  BP: 142/80 131/84  Pulse: 61 57  Temp: 35.6 C 36.6 C  Resp: 16 12    Complications: No apparent anesthesia complications

## 2015-12-11 NOTE — Op Note (Signed)
OPERATIVE NOTE  Christopher Higgins [id number] 12/11/2015   PREOPERATIVE DIAGNOSIS:  Nuclear sclerotic cataract left eye. H25.12   POSTOPERATIVE DIAGNOSIS:    Nuclear sclerotic cataract left eye.     PROCEDURE:  Phacoemusification with posterior chamber intraocular lens placement of the left eye   LENS:   Implant Name Type Inv. Item Serial No. Manufacturer Lot No. LRB No. Used  LENS IOL TECNIS 14.5 - [id number] Intraocular Lens LENS IOL TECNIS 14.5 [id number] AMO   Left 1        ULTRASOUND TIME: 17 % of 1 minutes, 45 seconds.  CDE 14.1   SURGEON:  Wyonia Hough, MD   ANESTHESIA:  Topical with tetracaine drops and 2% Xylocaine jelly.   COMPLICATIONS:  None.   DESCRIPTION OF PROCEDURE:  The patient was identified in the holding room and transported to the operating room and placed in the supine position under the operating microscope.  The left eye was identified as the operative eye and it was prepped and draped in the usual sterile ophthalmic fashion.   A 1 millimeter clear-corneal paracentesis was made at the 1:30 position.  The anterior chamber was filled with Viscoat viscoelastic.  A 2.4 millimeter keratome was used to make a near-clear corneal incision at the 10:30 position.  .  A curvilinear capsulorrhexis was made with a cystotome and capsulorrhexis forceps.  Balanced salt solution was used to hydrodissect and hydrodelineate the nucleus.   Phacoemulsification was then used in stop and chop fashion to remove the lens nucleus and epinucleus.  The remaining cortex was then removed using the irrigation and aspiration handpiece. Provisc was then placed into the capsular bag to distend it for lens placement.  A lens was then injected into the capsular bag.  The remaining viscoelastic was aspirated.   Wounds were hydrated with balanced salt solution.  The anterior chamber was inflated to a physiologic pressure with balanced salt solution. Cefuroxime 0.1 ml of a 10mg /ml solution  was injected into the anterior chamber for a dose of 1 mg of intracameral antibiotic at the completion of the case.  Miostat was placed into the anterior chamber to constrict the pupil.  No wound leaks were noted.  Topical Vigamox drops and Maxitrol ointment were applied to the eye.  The patient was taken to the recovery room in stable condition without complications of anesthesia or surgery  [name] 12/11/2015, 8:03 AM

## 2016-02-18 ENCOUNTER — Encounter: Payer: Self-pay | Admitting: Ophthalmology

## 2016-02-26 ENCOUNTER — Encounter: Payer: Self-pay | Admitting: Ophthalmology

## 2020-12-23 ENCOUNTER — Other Ambulatory Visit: Payer: Self-pay | Admitting: Internal Medicine

## 2020-12-23 DIAGNOSIS — R748 Abnormal levels of other serum enzymes: Secondary | ICD-10-CM

## 2021-01-20 ENCOUNTER — Ambulatory Visit
Admission: RE | Admit: 2021-01-20 | Discharge: 2021-01-20 | Disposition: A | Discharge status: Home / Self Care | Payer: Federal, State, Local not specified - PPO | Source: Ambulatory Visit | Attending: Internal Medicine | Admitting: Internal Medicine

## 2021-01-20 ENCOUNTER — Other Ambulatory Visit: Payer: Self-pay

## 2021-01-20 DIAGNOSIS — R748 Abnormal levels of other serum enzymes: Secondary | ICD-10-CM | POA: Diagnosis present
